# Patient Record
Sex: Female | Born: 1975 | Race: White | Hispanic: No | Smoking: Current every day smoker
Health system: Southern US, Community
[De-identification: ages and names within clinical notes are randomized; demographics above are authoritative.]

## PROBLEM LIST (undated history)

## (undated) DIAGNOSIS — F419 Anxiety disorder, unspecified: Secondary | ICD-10-CM

## (undated) DIAGNOSIS — F32A Depression, unspecified: Secondary | ICD-10-CM

## (undated) DIAGNOSIS — F329 Major depressive disorder, single episode, unspecified: Secondary | ICD-10-CM

## (undated) DIAGNOSIS — R928 Other abnormal and inconclusive findings on diagnostic imaging of breast: Secondary | ICD-10-CM

## (undated) HISTORY — DX: Anxiety disorder, unspecified: F41.9

## (undated) HISTORY — DX: Major depressive disorder, single episode, unspecified: F32.9

## (undated) HISTORY — PX: WISDOM TOOTH EXTRACTION: SHX21

## (undated) HISTORY — PX: COLPOSCOPY: SHX161

## (undated) HISTORY — DX: Depression, unspecified: F32.A

## (undated) HISTORY — DX: Other abnormal and inconclusive findings on diagnostic imaging of breast: R92.8

## (undated) HISTORY — PX: TONSILLECTOMY: SUR1361

---

## 1998-10-07 ENCOUNTER — Emergency Department (HOSPITAL_COMMUNITY): Admission: EM | Admit: 1998-10-07 | Discharge: 1998-10-07 | Payer: Self-pay | Admitting: Emergency Medicine

## 1998-10-07 ENCOUNTER — Encounter: Payer: Self-pay | Admitting: Emergency Medicine

## 2001-01-02 ENCOUNTER — Other Ambulatory Visit: Admission: RE | Admit: 2001-01-02 | Discharge: 2001-01-02 | Payer: Self-pay | Admitting: Family Medicine

## 2002-01-28 ENCOUNTER — Other Ambulatory Visit: Admission: RE | Admit: 2002-01-28 | Discharge: 2002-01-28 | Payer: Self-pay | Admitting: Family Medicine

## 2003-05-07 ENCOUNTER — Other Ambulatory Visit: Admission: RE | Admit: 2003-05-07 | Discharge: 2003-05-07 | Payer: Self-pay | Admitting: Family Medicine

## 2004-03-08 ENCOUNTER — Inpatient Hospital Stay (HOSPITAL_COMMUNITY): Admission: AD | Admit: 2004-03-08 | Discharge: 2004-03-10 | Payer: Self-pay | Admitting: *Deleted

## 2004-05-17 ENCOUNTER — Other Ambulatory Visit: Admission: RE | Admit: 2004-05-17 | Discharge: 2004-05-17 | Payer: Self-pay | Admitting: Obstetrics and Gynecology

## 2007-06-14 HISTORY — PX: TUBAL LIGATION: SHX77

## 2009-03-24 ENCOUNTER — Ambulatory Visit: Payer: Self-pay | Admitting: Urology

## 2009-04-01 ENCOUNTER — Ambulatory Visit: Payer: Self-pay | Admitting: Urology

## 2011-02-16 ENCOUNTER — Ambulatory Visit: Payer: Self-pay

## 2011-02-28 ENCOUNTER — Ambulatory Visit: Payer: Self-pay

## 2011-08-17 ENCOUNTER — Ambulatory Visit: Payer: Self-pay

## 2012-02-28 ENCOUNTER — Ambulatory Visit: Payer: Self-pay

## 2012-11-09 ENCOUNTER — Encounter: Payer: Self-pay | Admitting: *Deleted

## 2013-03-20 ENCOUNTER — Encounter: Payer: Self-pay | Admitting: *Deleted

## 2013-03-20 ENCOUNTER — Ambulatory Visit: Payer: Self-pay

## 2013-04-02 ENCOUNTER — Ambulatory Visit: Payer: Self-pay

## 2013-04-09 ENCOUNTER — Ambulatory Visit: Payer: Self-pay | Admitting: General Surgery

## 2013-04-29 ENCOUNTER — Ambulatory Visit: Payer: Self-pay | Admitting: General Surgery

## 2013-06-04 ENCOUNTER — Encounter: Payer: Self-pay | Admitting: General Surgery

## 2013-06-04 ENCOUNTER — Ambulatory Visit (INDEPENDENT_AMBULATORY_CARE_PROVIDER_SITE_OTHER): Payer: PRIVATE HEALTH INSURANCE | Admitting: General Surgery

## 2013-06-04 VITALS — BP 120/68 | HR 82 | Resp 14 | Ht 60.0 in | Wt 128.0 lb

## 2013-06-04 DIAGNOSIS — N6452 Nipple discharge: Secondary | ICD-10-CM | POA: Insufficient documentation

## 2013-06-04 DIAGNOSIS — N644 Mastodynia: Secondary | ICD-10-CM

## 2013-06-04 DIAGNOSIS — N6459 Other signs and symptoms in breast: Secondary | ICD-10-CM

## 2013-06-04 NOTE — Patient Instructions (Signed)
Patient to take Aleve 2 tablets twice daily for 10 days for pain. Patient to return as needed.

## 2013-06-04 NOTE — Progress Notes (Signed)
Patient ID: Sandra Barry, female   DOB: [birth year], 37 y.o.   MRN: [id number]  Chief Complaint  Patient presents with  . Follow-up    mammogram    HPI Sandra Barry is a 37 y.o. female who presents for a breast evaluation. The most recent mammogram and ultrasound was done on 04/02/13. She checks her breasts on occasion. She does get regular mammograms done. She states she has right breast discharge that she has noticed for approximately 1 year. She describes the discharge as milky looking. She notices the discharge more now than before. She denies noticing any problems with her breasts until she saw her primary care doctor who point the spot out to her. She does complain of occasional left tenderness that is random. The pain is present for more than 30 minutes at a time. No injuries to the breasts.     HPI  Past Medical History  Diagnosis Date  . Depression   . Anxiety   . Abnormal mammogram, unspecified     Past Surgical History  Procedure Laterality Date  . Tubal ligation  2009  . Tonsillectomy    . Wisdom tooth extraction      Family History  Problem Relation Age of Onset  . Breast cancer Maternal Aunt   . Breast cancer Maternal Grandmother   . Breast cancer Maternal Aunt     Social History History  Substance Use Topics  . Smoking status: Current Every Day Smoker -- 0.50 packs/day for 15 years  . Smokeless tobacco: Never Used  . Alcohol Use: No    Allergies  Allergen Reactions  . Demerol [Meperidine] Nausea And Vomiting  . Latex Itching    Current Outpatient Prescriptions  Medication Sig Dispense Refill  . doxepin (SINEQUAN) 25 MG capsule Take 25 mg by mouth at bedtime.       No current facility-administered medications for this visit.    Review of Systems Review of Systems  Constitutional: Negative.   Respiratory: Negative.   Cardiovascular: Negative.     Blood pressure 120/68, pulse 82, resp. rate 14, height 5' (1.524 m), weight 128 lb (58.06  kg), last menstrual period 05/13/2013.  Physical Exam Physical Exam  Constitutional: She is oriented to person, place, and time. She appears well-developed and well-nourished.  Neck: No thyromegaly present.  Cardiovascular: Normal rate, regular rhythm and normal heart sounds.   Pulmonary/Chest: Effort normal and breath sounds normal. Right breast exhibits no inverted nipple, no mass, no nipple discharge, no skin change and no tenderness. Left breast exhibits tenderness (tenderness in the axillary tail as well as medially at T4 in the left breast. ). Left breast exhibits no inverted nipple, no mass, no nipple discharge and no skin change.    1 duct with a single drop of drainage in the right breast.   Lymphadenopathy:    She has no cervical adenopathy.    She has no axillary adenopathy.  Neurological: She is alert and oriented to person, place, and time.  Skin: Skin is warm and dry.    Data Reviewed Bilateral mammograms and left breast ultrasound dated April 02, 2013 were reviewed. The palpable mass the left breast was thought to represent a normal fat lobule. Mammograms were otherwise unremarkable. Stable nodule in the upper outer aspect of the left breast without change since September 2012. (2012-2014 mammograms reviewed)    Previous ultrasound examination of the upper outer quadrant of left breast completed in October 2013 showed a 0.3 x 0.35 cm area  4 cm from the nipple thought to represent multiple contiguous cysts. This is away from the area of the patient's clinical concern which was in the axillary tail.  Assessment    Benign breast exam.  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      Plan    The patient was discouraged from manipulating the nipples. Smoking cessation might improve the drainage, but is unlikely.  Followup examinations will be on an as-needed basis. Screening mammograms at age 24.        Sandra Barry 06/04/2013, 9:17 PM

## 2014-04-14 ENCOUNTER — Encounter: Payer: Self-pay | Admitting: General Surgery

## 2014-11-20 IMAGING — MG MM DIGITAL DIAGNOSTIC BILAT W/ CAD
1 series · 5 of 5 positions shown · non-contrast
Comparison: Previous examinations.

REASON FOR EXAM: LT BRST NODULE [id number]
COMMENTS:

PROCEDURE:     MAM - MAM [REDACTED] DIG DIAGNOSTIC W/CAD  - April 02, 2013  [DATE]
CLINICAL DATA: Small mass felt on recent physical examination in
the upper outer left breast. The patient reports a clear nipple
discharge on the right for multiple years, only when the breast is
compressed during sexual activity.
EXAM:
DIGITAL SCREENING BILATERAL MAMMOGRAM WITH CAD; ULTRASOUND LEFT
BREAST

[R CC · right · 5 of 5 slices shown]
[im 1/5]
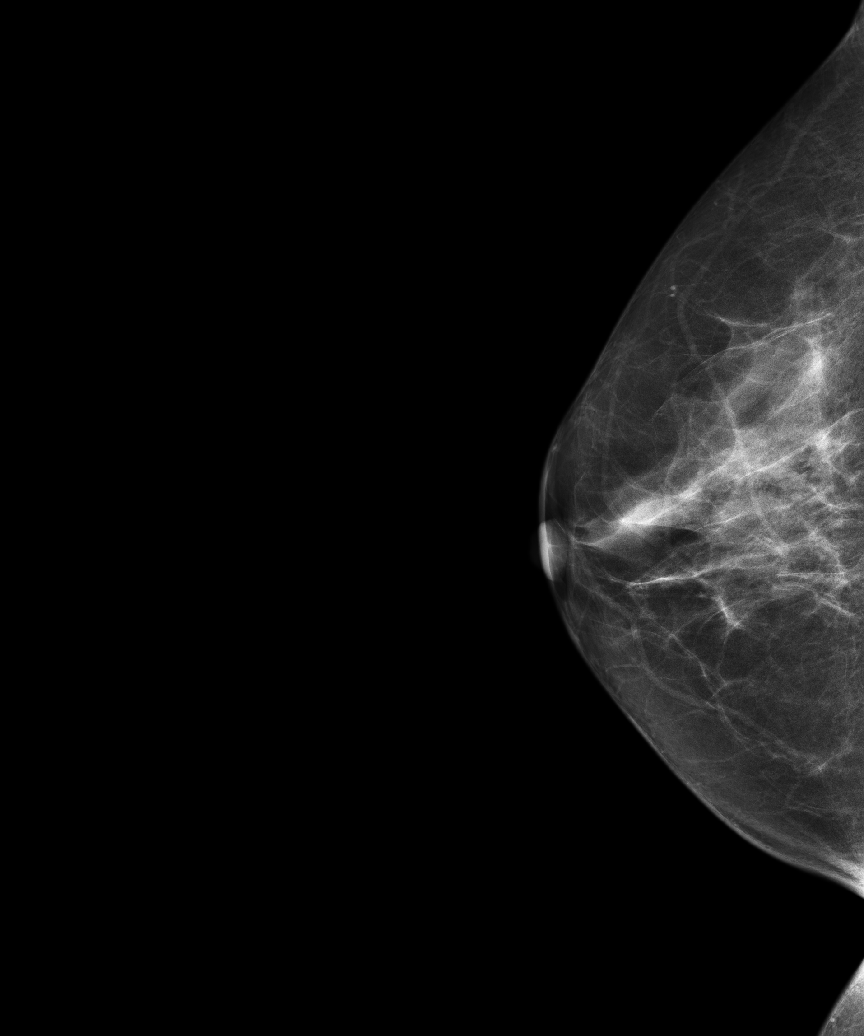
[im 2/5]
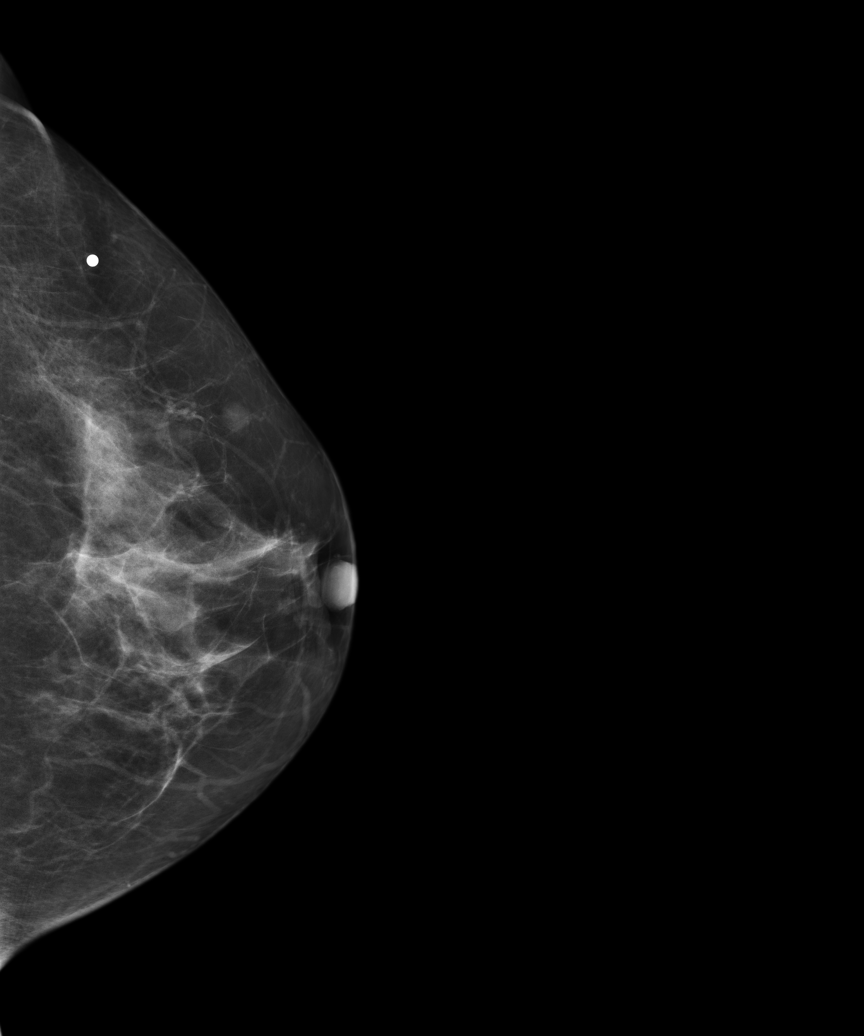
[im 3/5]
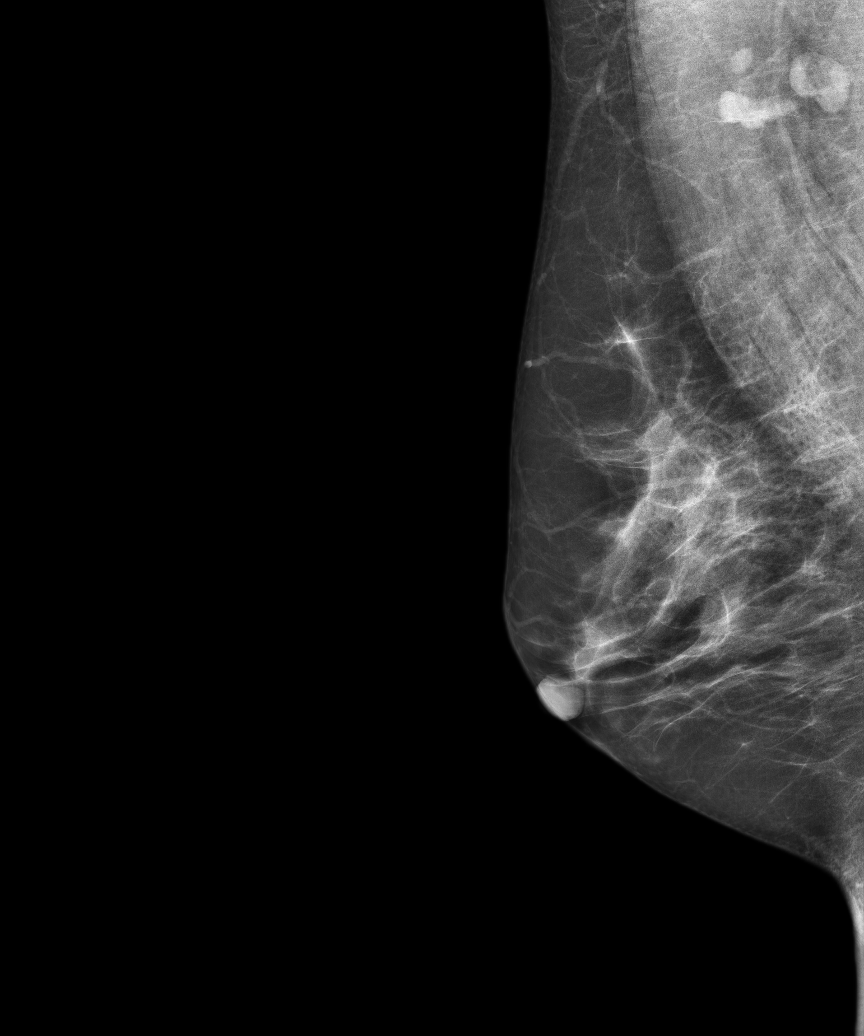
[im 4/5]
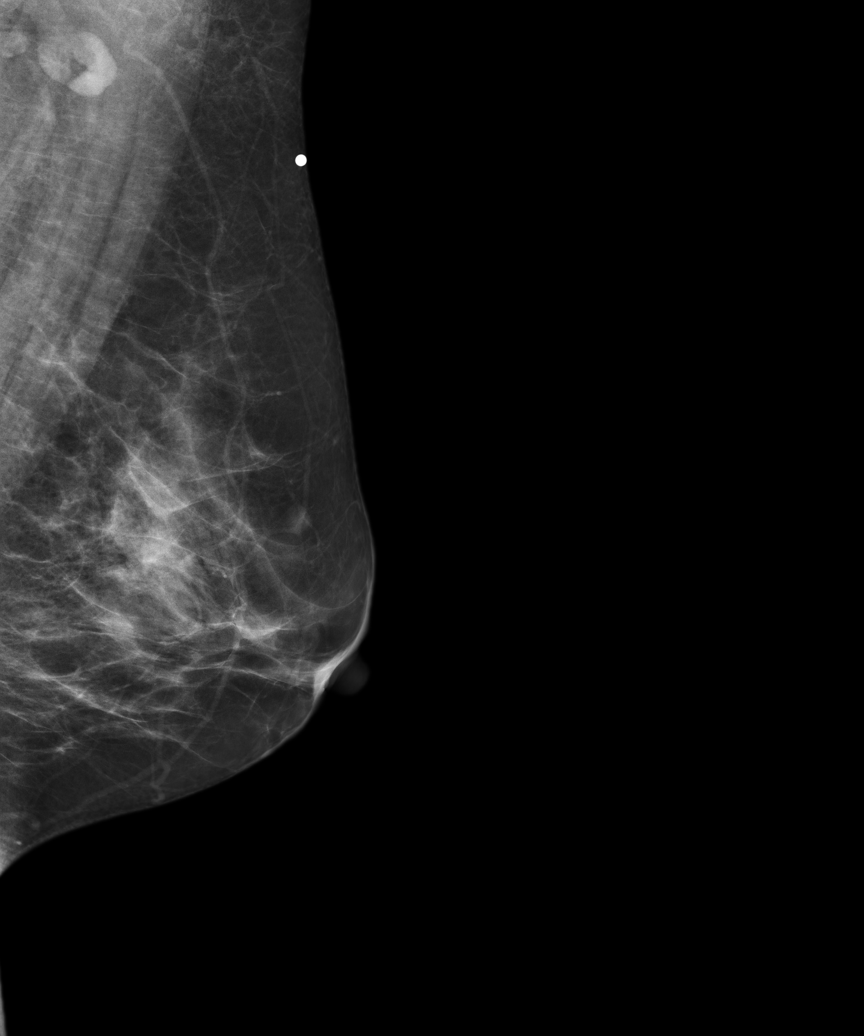
[im 5/5]
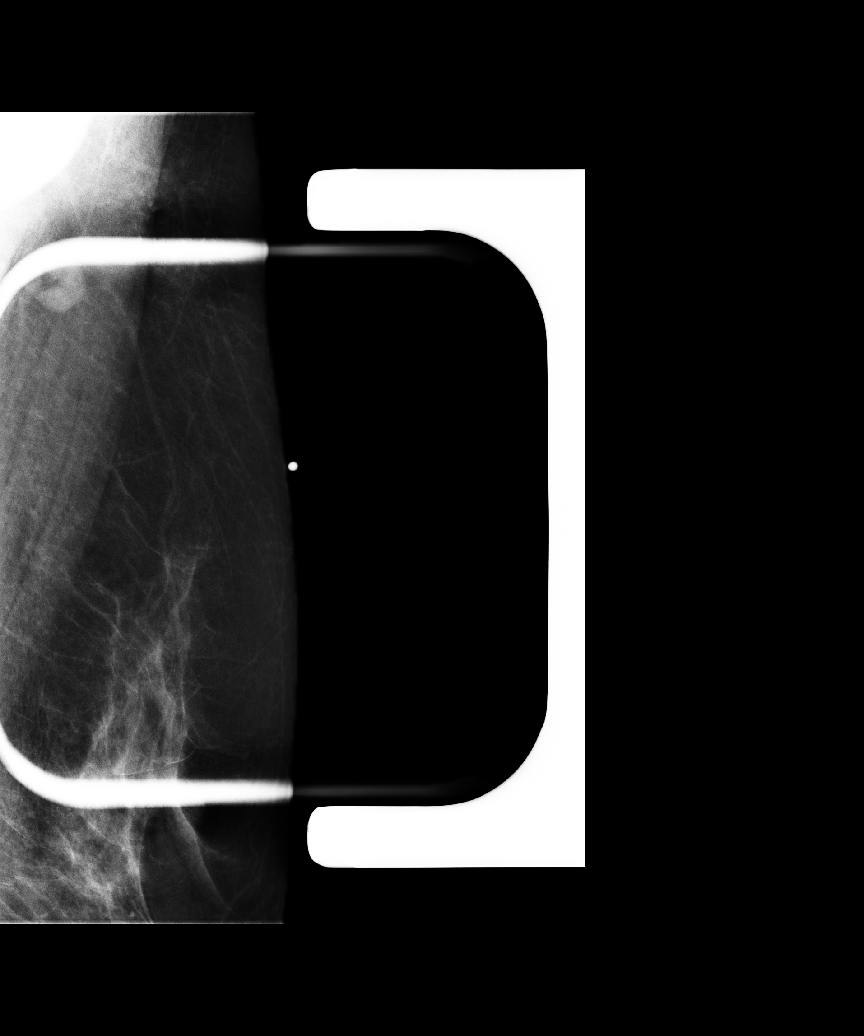

[5 of 5 positions shown; findings below may reference images not displayed]

ACR Breast Density Category c: The breasts are heterogeneously
dense, which may obscure small masses.
FINDINGS: The previously evaluated rounded nodule in the anterior aspect of
the upper-outer left breast is unchanged since 02/16/2011. This has
been previously evaluated with ultrasound in Dr. [REDACTED] by
history. No new findings suspicious for malignancy in either breast.
Normal appearing fatty tissue at the location of the palpable mass
on the left, marked with a metallic marker.

Mammographic images were processed with CAD.

On physical exam,there is an approximately 8 x 5 mm oval, soft,
mobile, smoothly marginated mass in the upper outer left breast near
the axilla..

Ultrasound is performed, showing normal appearing fat lobules at the
location of the palpable mass. No true cystic or solid masses are
seen.
IMPRESSION: 1. The palpable mass on the left is a normal fat lobule or lipoma.
2. Mammographically stable nodule in the anterior aspect of the
upper-outer left breast since 02/16/2011. The greater than 2 year
stability is compatible with a benign process.
3. No evidence of malignancy.

RECOMMENDATION:
Bilateral screening mammogram at age 42.

I have discussed the findings and recommendations with the patient.
Results were also provided in writing at the conclusion of the
visit.

BI-RADS CATEGORY  2: Benign Finding(s)

## 2017-08-21 ENCOUNTER — Ambulatory Visit: Payer: Self-pay

## 2017-10-16 ENCOUNTER — Ambulatory Visit: Payer: Self-pay | Attending: Oncology | Admitting: *Deleted

## 2017-10-16 ENCOUNTER — Encounter (INDEPENDENT_AMBULATORY_CARE_PROVIDER_SITE_OTHER): Payer: Self-pay

## 2017-10-16 ENCOUNTER — Ambulatory Visit
Admission: RE | Admit: 2017-10-16 | Discharge: 2017-10-16 | Disposition: A | Discharge status: Home / Self Care | Payer: Self-pay | Source: Ambulatory Visit | Attending: Oncology | Admitting: Oncology

## 2017-10-16 VITALS — BP 92/65 | HR 106 | Temp 99.2°F

## 2017-10-16 DIAGNOSIS — Z0000 Encounter for general adult medical examination without abnormal findings: Secondary | ICD-10-CM

## 2017-10-16 NOTE — Patient Instructions (Signed)
HPV Test The human papillomavirus (HPV) test is used to look for high-risk types of HPV infection. HPV is a group of about 100 viruses. Many of these viruses cause growths on, in, or around the genitals. Most HPV viruses cause infections that usually go away without treatment. However, HPV types 6, 11, 16, and 18 are considered high-risk types of HPV that can increase your risk of cancer of the cervix or anus if the infection is left untreated. An HPV test identifies the DNA (genetic) strands of the HPV infection, so it is also referred to as the HPV DNA test. Although HPV is found in both males and females, the HPV test is only used to screen for increased cancer risk in females:  With an abnormal Pap test.  After treatment of an abnormal Pap test.  Between the ages of 30 and 65.  After treatment of a high-risk HPV infection.  The HPV test may be done at the same time as a pelvic exam and Pap test in females over the age of 30. Both the HPV test and Pap test require a sample of cells from the cervix. How do I prepare for this test?  Do not douche or take a bath for 24-48 hours before the test or as directed by your health care provider.  Do not have sex for 24-48 hours before the test or as directed by your health care provider.  You may be asked to reschedule the test if you are menstruating.  You will be asked to urinate before the test. What do the results mean? It is your responsibility to obtain your test results. Ask the lab or department performing the test when and how you will get your results. Talk with your health care provider if you have any questions about your results. Your result will be negative or positive. Meaning of Negative Test Results A negative HPV test result means that no HPV was found, and it is very likely that you do not have HPV. Meaning of Positive Test Results A positive HPV test result indicates that you have HPV.  If your test result shows the presence  of any high-risk HPV strains, you may have an increased risk of developing cancer of the cervix or anus if the infection is left untreated.  If any low-risk HPV strains are found, you are not likely to have an increased risk of cancer.  Discuss your test results with your health care provider. He or she will use the results to make a diagnosis and determine a treatment plan that is right for you. Talk with your health care provider to discuss your results, treatment options, and if necessary, the need for more tests. Talk with your health care provider if you have any questions about your results. This information is not intended to replace advice given to you by your health care provider. Make sure you discuss any questions you have with your health care provider. Document Released: 06/24/2004 Document Revised: 02/03/2016 Document Reviewed: 10/15/2013 Elsevier Interactive Patient Education  2018 Elsevier Inc.   Gave patient hand-out, Women Staying Healthy, Active and Well from [employer], with education on breast health, pap smears, heart and colon health.  

## 2017-10-16 NOTE — Progress Notes (Signed)
  Subjective:     Patient ID: Sandra Barry, female   DOB: Jul 01, 1975, 42 y.o.   MRN: [id number]  HPI   Review of Systems     Objective:   Physical Exam  Pulmonary/Chest: Right breast exhibits no inverted nipple, no mass, no nipple discharge, no skin change and no tenderness. Left breast exhibits no inverted nipple, no mass, no nipple discharge, no skin change and no tenderness.  Abdominal: There is no splenomegaly or hepatomegaly.  Genitourinary: Rectal exam shows no mass. No labial fusion. There is no rash, tenderness, lesion or injury on the right labia. There is no rash, tenderness, lesion or injury on the left labia. Cervix exhibits no motion tenderness, no discharge and no friability. Right adnexum displays no mass, no tenderness and no fullness. Left adnexum displays no mass, no tenderness and no fullness. No erythema, tenderness or bleeding in the vagina. No foreign body in the vagina. No signs of injury around the vagina. No vaginal discharge found.         Assessment:     42 year old White female returns to [facility] for clinical breast exam, pap and mammogram.  Clinical breast exam unremarkable.  Taught self breast awareness.  Specimen collected for pap smear without difficulty.  Patient has been screened for eligibility.  She does not have any insurance, Medicare or Medicaid.  She also meets financial eligibility.  Hand-out given on the Affordable Care Act.    Plan:     Screening mammogram ordered.  Specimen for pap smear sent to the lab.  Will follow-up per [facility] protocol.

## 2017-10-19 LAB — PAP LB AND HPV HIGH-RISK
HPV, HIGH-RISK: POSITIVE — AB
PAP SMEAR COMMENT: 0

## 2017-10-25 ENCOUNTER — Encounter: Payer: Self-pay | Admitting: *Deleted

## 2017-10-25 NOTE — Progress Notes (Signed)
Patient called today to get her pap results.  Informed her of her normal mammogram and abnormal pap of HPV+ ASCUS.  Reviewed need for gyn consult for colposcopy and possible biopsy.  Appointment scheduled to see Dr. Jerene Pitch at [facility] on 11/10/17 @ 8:30.  Will follow-up per protocol.

## 2017-11-10 ENCOUNTER — Ambulatory Visit: Payer: Self-pay | Admitting: Obstetrics and Gynecology

## 2017-11-10 ENCOUNTER — Encounter: Payer: Self-pay | Admitting: Obstetrics and Gynecology

## 2017-11-10 VITALS — BP 98/54 | HR 87 | Ht 61.0 in | Wt 112.0 lb

## 2017-11-10 DIAGNOSIS — R87610 Atypical squamous cells of undetermined significance on cytologic smear of cervix (ASC-US): Secondary | ICD-10-CM

## 2017-11-10 DIAGNOSIS — R87810 Cervical high risk human papillomavirus (HPV) DNA test positive: Principal | ICD-10-CM

## 2017-11-10 NOTE — Progress Notes (Signed)
   GYNECOLOGY CLINIC COLPOSCOPY PROCEDURE NOTE  42 y.o. G4P4 here for colposcopy for ASCUS with POSITIVE high risk HPV  pap smear on 10/16/17. Discussed underlying role for HPV infection in the development of cervical dysplasia, its natural history and progression/regression, need for surveillance.  Is the patient  pregnant: No LMP: Patient's last menstrual period was 10/30/2017 (approximate). Smoking status:  reports that she has been smoking.  She has a 7.50 pack-year smoking history. She has never used smokeless tobacco. Contraception: tubal ligation Number current sexual partners:  1 Number of partners in lifetime:  Less than 10 Future fertility desired:  No  Patient given informed consent, signed copy in the chart, time out was performed.  The patient was position in dorsal lithotomy position. Speculum was placed the cervix was visualized.   After application of acetic acid colposcopic inspection of the cervix was undertaken.   Colposcopy adequate, full visualization of transformation zone: No acetowhite lesion(s) noted at 5,6,7 o'clock and abnormal vessels noted at 6 o'clock; corresponding biopsies obtained.   ECC specimen obtained:  Yes  All specimens were labeled and sent to pathology.   Patient was given post procedure instructions.  Will follow up pathology and manage accordingly.  Routine preventative health maintenance measures emphasized.  Physical Exam  Genitourinary:      Adelene Idlerhristanna Schuman MD [facility], [facility] 11/10/17 9:12 AM

## 2017-11-10 NOTE — Patient Instructions (Signed)
Colposcopy, Care After  This sheet gives you information about how to care for yourself after your procedure. Your doctor may also give you more specific instructions. If you have problems or questions, contact your doctor.  What can I expect after the procedure?  If you did not have a tissue sample removed (did not have a biopsy), you may only have some spotting for a few days. You can go back to your normal activities.  If you had a tissue sample removed, it is common to have:  · Soreness and pain. This may last for a few days.  · Light-headedness.  · Mild bleeding from your vagina or dark-colored, grainy discharge from your vagina. This may last for a few days. You may need to wear a sanitary pad.  · Spotting for at least 48 hours after the procedure.    Follow these instructions at home:  · Take over-the-counter and prescription medicines only as told by your doctor. Ask your doctor what medicines you can start taking again. This is very important if you take blood-thinning medicine.  · Do not drive or use heavy machinery while taking prescription pain medicine.  · For 3 days, or as long as your doctor tells you, avoid:  ? Douching.  ? Using tampons.  ? Having sex.  · If you use birth control (contraception), keep using it.  · Limit activity for the first day after the procedure. Ask your doctor what activities are safe for you.  · It is up to you to get the results of your procedure. Ask your doctor when your results will be ready.  · Keep all follow-up visits as told by your doctor. This is important.  Contact a doctor if:  · You get a skin rash.  Get help right away if:  · You are bleeding a lot from your vagina. It is a lot of bleeding if you are using more than one pad an hour for 2 hours in a row.  · You have clumps of blood (blood clots) coming from your vagina.  · You have a fever.  · You have chills  · You have pain in your lower belly (pelvic area).  · You have signs of infection, such as vaginal  discharge that is:  ? Different than usual.  ? Yellow.  ? Bad-smelling.  · You have very pain or cramps in your lower belly that do not get better with medicine.  · You feel light-headed.  · You feel dizzy.  · You pass out (faint).  Summary  · If you did not have a tissue sample removed (did not have a biopsy), you may only have some spotting for a few days. You can go back to your normal activities.  · If you had a tissue sample removed, it is common to have mild pain and spotting for 48 hours.  · For 3 days, or as long as your doctor tells you, avoid douching, using tampons and having sex.  · Get help right away if you have bleeding, very bad pain, or signs of infection.  This information is not intended to replace advice given to you by your health care provider. Make sure you discuss any questions you have with your health care provider.  Document Released: 11/16/2007 Document Revised: 02/17/2016 Document Reviewed: 02/17/2016  Elsevier Interactive Patient Education © 2018 Elsevier Inc.

## 2017-11-15 LAB — PATHOLOGY

## 2017-11-16 NOTE — Progress Notes (Signed)
Can you contact patient with results? I attempted, she needs a repeat pap smear in 1 year.  Thank you

## 2017-11-16 NOTE — Progress Notes (Signed)
Called patient on 11/16/17 at 08:14, no answer, left message asking patient to call. She will need a repeat pap smear in 1 year.

## 2017-12-08 ENCOUNTER — Encounter: Payer: Self-pay | Admitting: *Deleted

## 2017-12-08 NOTE — Progress Notes (Signed)
Letter mailed to inform patient of the need to return in one year for annual screening mammogram and pap smear.  Appointment scheduled for 10/17/18 @ 11:00.  [facility] [place]

## 2018-10-17 ENCOUNTER — Ambulatory Visit: Payer: Self-pay

## 2018-10-24 ENCOUNTER — Ambulatory Visit: Payer: Self-pay

## 2019-06-05 IMAGING — MG MM DIGITAL SCREENING BILAT W/ TOMO W/ CAD
8 series · 9 of 24 positions shown · non-contrast
Comparison: Previous exam(s).

CLINICAL DATA: Screening.

EXAM:
DIGITAL SCREENING BILATERAL MAMMOGRAM WITH TOMO AND CAD

[R CC synth-2D]
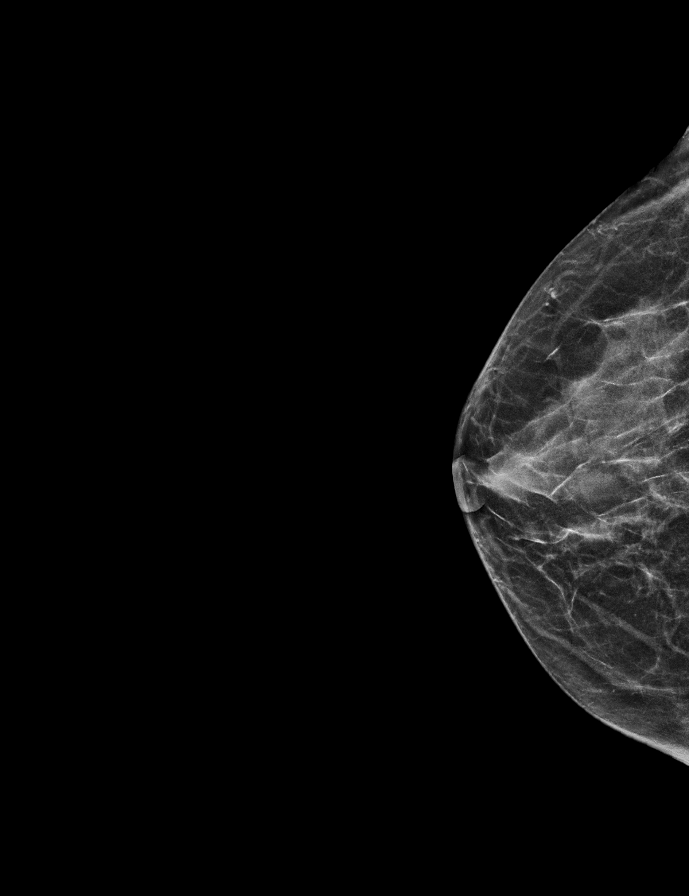

[L MLO synth-2D]
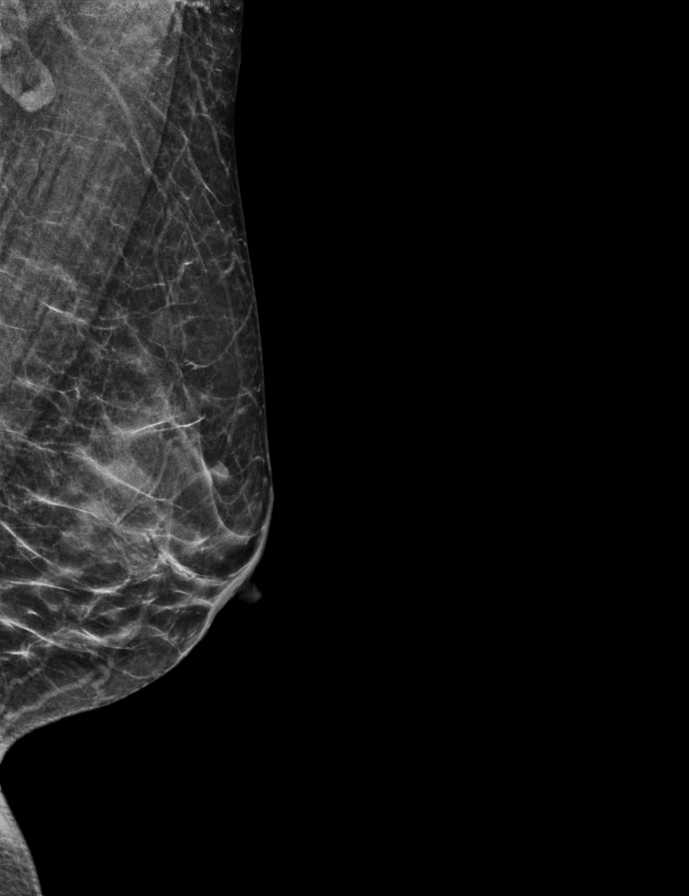

[R MLO synth-2D]
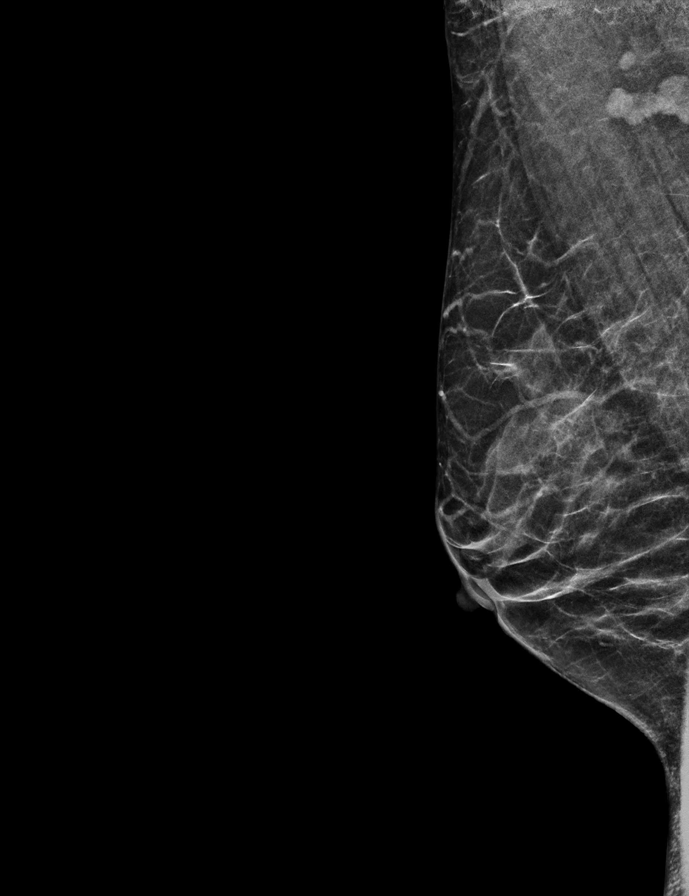

[L CC synth-2D]
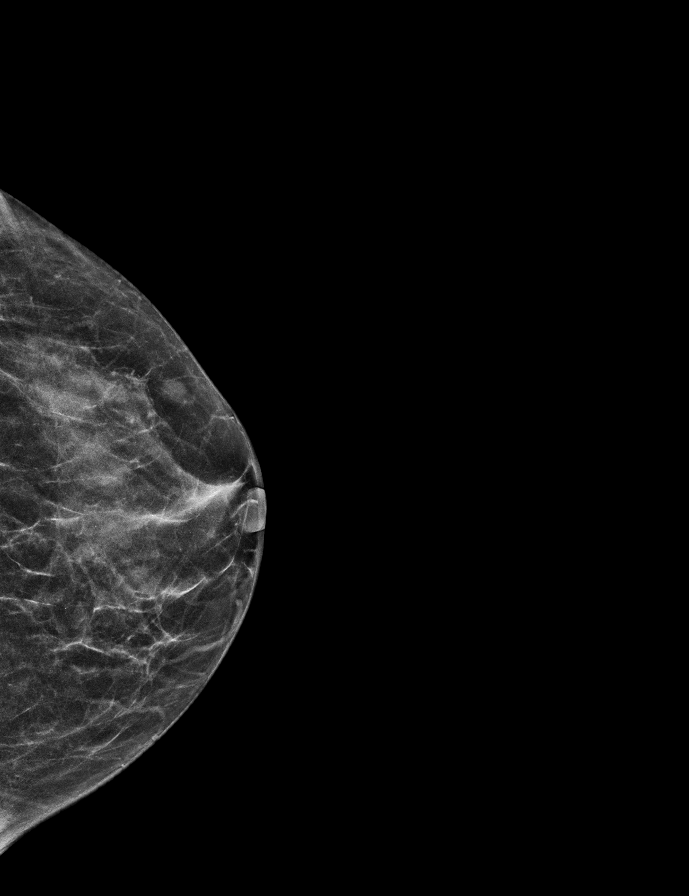

[R MLO tomo · 2 of 39 frames shown]
[frame 13/39]
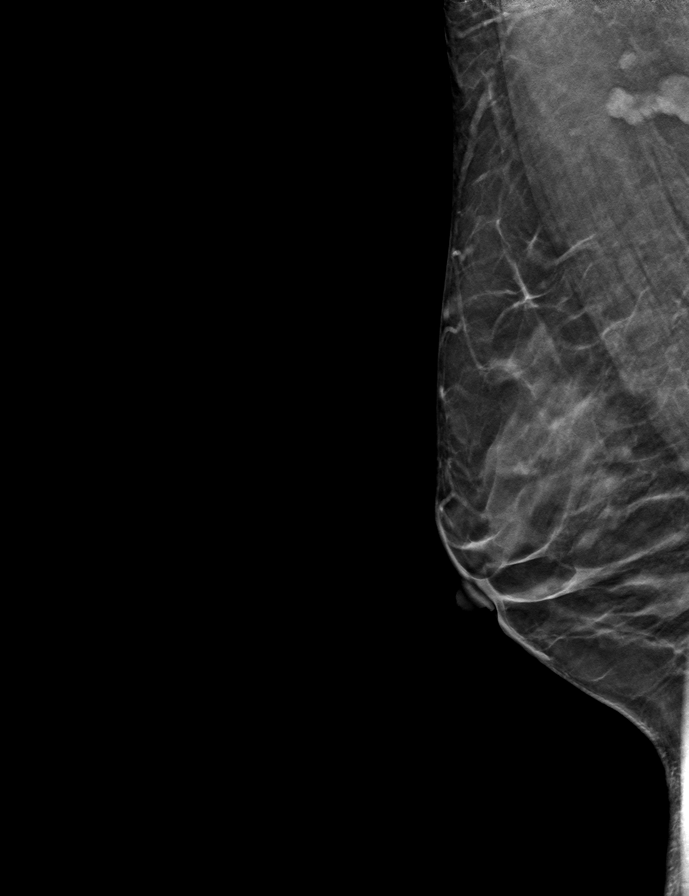
[frame 20/39]
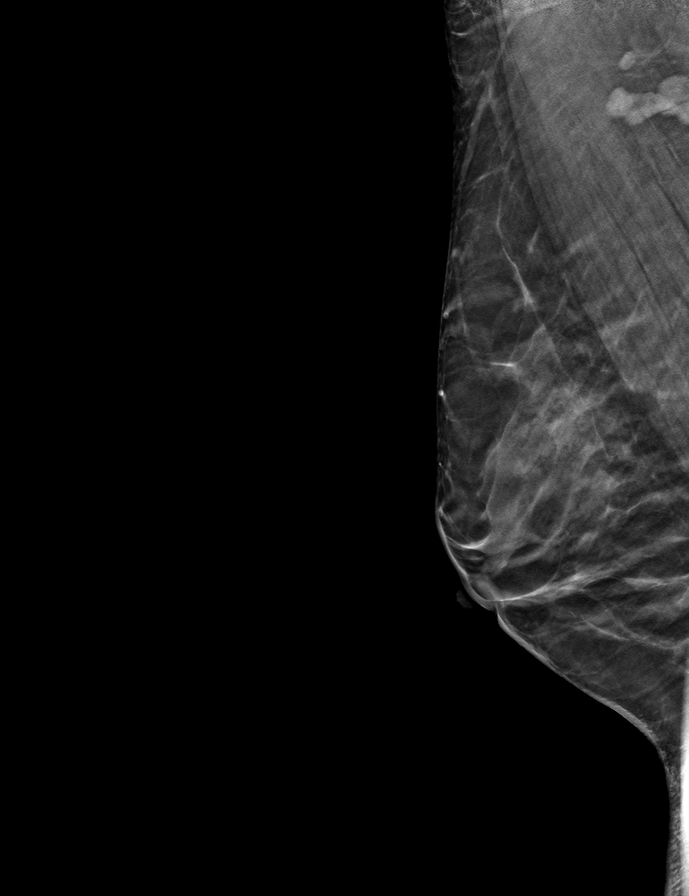

[L MLO tomo · tomo slice 20/39.0]
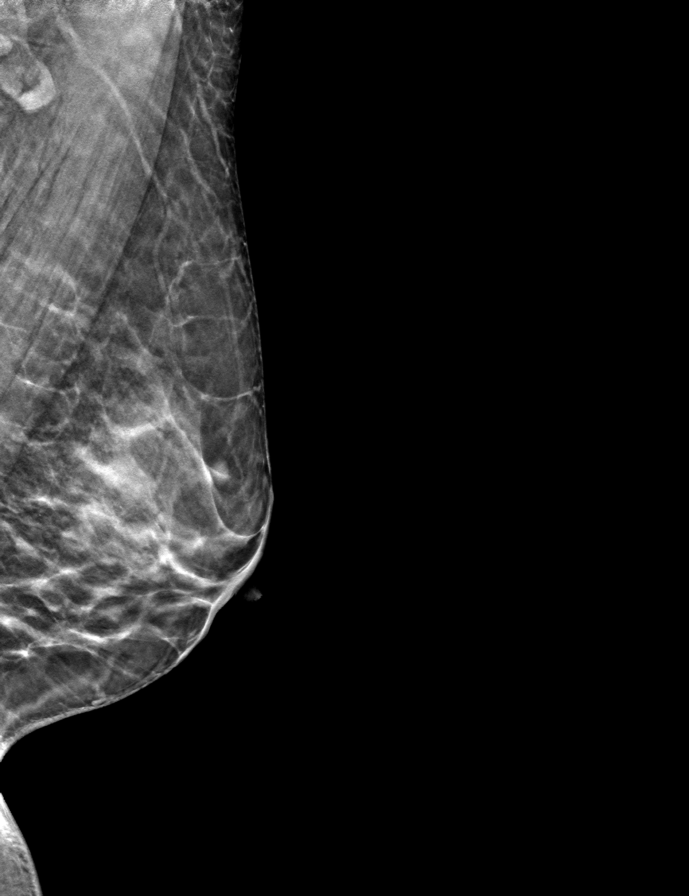

[R CC tomo · tomo slice 23/44.0]
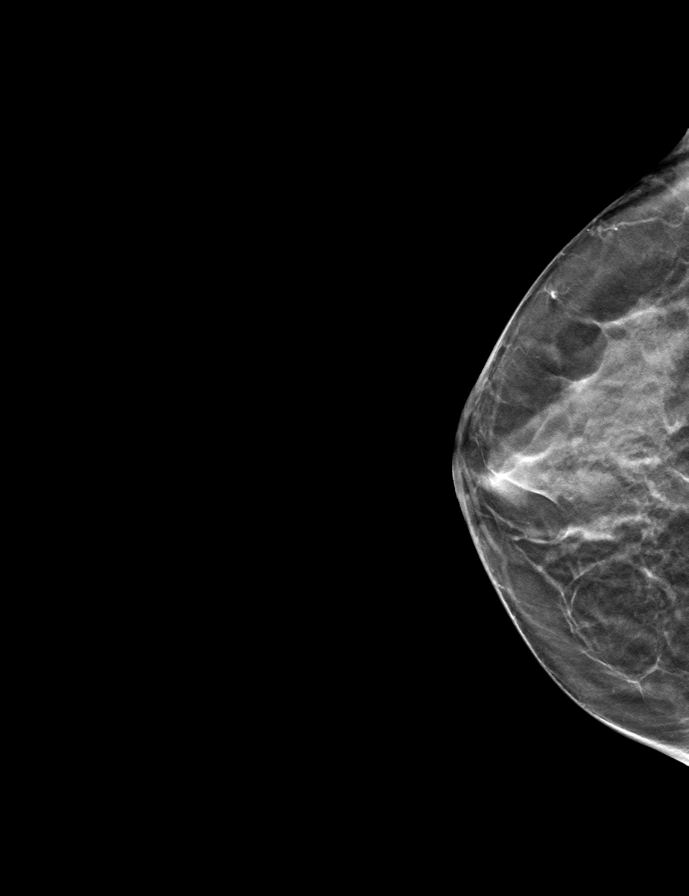

[L CC tomo · tomo slice 21/41.0]
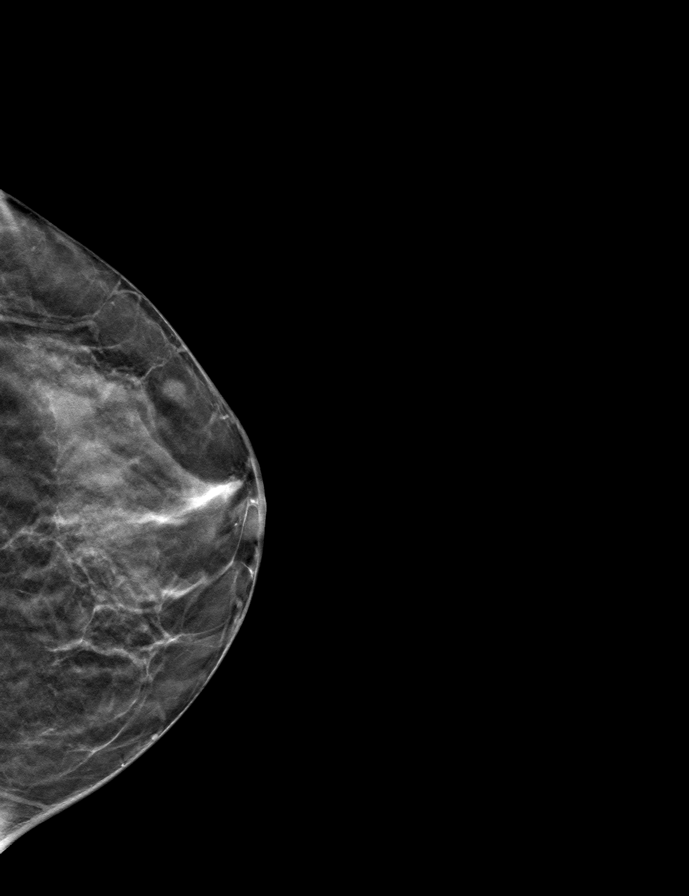

[9 of 24 positions shown; findings below may reference images not displayed]

ACR Breast Density Category c: The breast tissue is heterogeneously
dense, which may obscure small masses.
FINDINGS: There are no findings suspicious for malignancy. Images were
processed with CAD.
IMPRESSION: No mammographic evidence of malignancy. A result letter of this
screening mammogram will be mailed directly to the patient.

RECOMMENDATION:
Screening mammogram in one year. (Code:[id number])

BI-RADS CATEGORY  1: Negative.

## 2019-10-29 ENCOUNTER — Ambulatory Visit
Admission: RE | Admit: 2019-10-29 | Discharge: 2019-10-29 | Disposition: A | Discharge status: Home / Self Care | Payer: Self-pay | Source: Ambulatory Visit | Attending: Oncology | Admitting: Oncology

## 2019-10-29 ENCOUNTER — Other Ambulatory Visit: Payer: Self-pay

## 2019-10-29 ENCOUNTER — Ambulatory Visit: Payer: Self-pay | Attending: Oncology

## 2019-10-29 VITALS — BP 110/70 | HR 81 | Temp 97.5°F | Ht 60.5 in | Wt 100.0 lb

## 2019-10-29 DIAGNOSIS — Z0000 Encounter for general adult medical examination without abnormal findings: Secondary | ICD-10-CM | POA: Insufficient documentation

## 2019-10-29 NOTE — Progress Notes (Signed)
  Subjective:     Patient ID: Carollee Massed, female   DOB: June 13, 1976, 44 y.o.   MRN: [id number]  HPI   Review of Systems     Objective:   Physical Exam Chest:     Breasts:        Right: No swelling, bleeding, inverted nipple, mass, nipple discharge, skin change or tenderness.        Left: No swelling, bleeding, inverted nipple, mass, nipple discharge, skin change or tenderness.     Comments: Bilateral fibroglandular tissue Genitourinary:    Labia:        Right: No rash, tenderness, lesion or injury.        Left: No rash, tenderness, lesion or injury.      Vagina: No signs of injury and foreign body. Vaginal discharge present. No erythema, tenderness, bleeding, lesions or prolapsed vaginal walls.     Cervix: Friability present. No cervical motion tenderness, discharge, lesion, erythema, cervical bleeding or eversion.     Uterus: Not deviated, not enlarged, not fixed, not tender and no uterine prolapse.      Adnexa:        Right: No mass, tenderness or fullness.         Left: No mass, tenderness or fullness.          Comments: Mild excoriation; Thin white non-odorous discharge       Assessment:     44 year old patient returns to [facility]. Patient screened, and meets [facility] eligibility.  Patient does not have insurance, Medicare or Medicaid.  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  Pelvic exam normal.  Scant bleeding on pap collection. Instructed patient on breast self awareness using teach back method.  Clinical breast exam unremarkable.    Plan:     Sent for bilateral screening mammogram.  Specimen collected for pap.

## 2019-11-02 LAB — IGP, APTIMA HPV: HPV Aptima: NEGATIVE

## 2019-11-06 NOTE — Progress Notes (Signed)
Phoned patient with [facility] 1 mammogram, and negative/negative pap results.  Patient to return in one year for screening.  Copy to [facility]

## 2021-01-06 ENCOUNTER — Other Ambulatory Visit: Payer: Self-pay | Admitting: Physician Assistant

## 2021-01-06 DIAGNOSIS — Z1231 Encounter for screening mammogram for malignant neoplasm of breast: Secondary | ICD-10-CM

## 2021-01-26 ENCOUNTER — Ambulatory Visit
Admission: RE | Admit: 2021-01-26 | Discharge: 2021-01-26 | Disposition: A | Discharge status: Home / Self Care | Payer: BC Managed Care – PPO | Source: Ambulatory Visit | Attending: Physician Assistant | Admitting: Physician Assistant

## 2021-01-26 ENCOUNTER — Other Ambulatory Visit: Payer: Self-pay

## 2021-01-26 DIAGNOSIS — Z1231 Encounter for screening mammogram for malignant neoplasm of breast: Secondary | ICD-10-CM | POA: Diagnosis present

## 2022-10-04 ENCOUNTER — Other Ambulatory Visit: Payer: Self-pay | Admitting: Family Medicine

## 2022-10-04 DIAGNOSIS — Z1231 Encounter for screening mammogram for malignant neoplasm of breast: Secondary | ICD-10-CM

## 2022-11-16 ENCOUNTER — Ambulatory Visit
Admission: RE | Admit: 2022-11-16 | Discharge: 2022-11-16 | Disposition: A | Discharge status: Home / Self Care | Payer: BC Managed Care – PPO | Source: Ambulatory Visit | Attending: Family Medicine | Admitting: Family Medicine

## 2022-11-16 DIAGNOSIS — Z1231 Encounter for screening mammogram for malignant neoplasm of breast: Secondary | ICD-10-CM | POA: Insufficient documentation

## 2024-04-04 ENCOUNTER — Telehealth: Payer: Self-pay

## 2024-04-04 NOTE — Telephone Encounter (Signed)
 Call returned to patient to discuss her referral.  Pt has been informed that her referral did not note routine screening colonoscopy .  Referral indicated pain in internal colon and history of constipation with hemorrhoids.   Referral notes also noted that she had experienced blood in stool.  Pt has been informed that since she is having GI Issues she would need to be evaluated in office prior to having a colonoscopy scheduled since this is not a routine screening colonoscopy.  She verbalized understanding and stated that she will need to call office back to schedule an appt, however this is not what she had discussed with her PCP.  She and her PCP discussed a colonoscopy to see where the pain was coming from-as she feels this is not hemorrhoids.  Thanks,  [place], CMA

## 2024-04-17 ENCOUNTER — Encounter: Payer: Self-pay | Admitting: General Practice
